# Patient Record
Sex: Female | Born: 1955 | Race: White | Hispanic: No | Smoking: Never smoker
Health system: Southern US, Community
[De-identification: ages and names within clinical notes are randomized; demographics above are authoritative.]

## PROBLEM LIST (undated history)

## (undated) HISTORY — PX: BREAST BIOPSY: SHX20

---

## 2014-09-17 ENCOUNTER — Ambulatory Visit (INDEPENDENT_AMBULATORY_CARE_PROVIDER_SITE_OTHER): Payer: BLUE CROSS/BLUE SHIELD | Admitting: Podiatry

## 2014-09-17 ENCOUNTER — Encounter: Payer: Self-pay | Admitting: Podiatry

## 2014-09-17 ENCOUNTER — Ambulatory Visit (INDEPENDENT_AMBULATORY_CARE_PROVIDER_SITE_OTHER): Payer: BLUE CROSS/BLUE SHIELD

## 2014-09-17 VITALS — BP 141/77 | HR 71 | Resp 16 | Ht 64.0 in | Wt 180.0 lb

## 2014-09-17 DIAGNOSIS — M79675 Pain in left toe(s): Secondary | ICD-10-CM

## 2014-09-17 DIAGNOSIS — L84 Corns and callosities: Secondary | ICD-10-CM

## 2014-09-17 DIAGNOSIS — M205X2 Other deformities of toe(s) (acquired), left foot: Secondary | ICD-10-CM

## 2014-09-17 DIAGNOSIS — M779 Enthesopathy, unspecified: Secondary | ICD-10-CM

## 2014-09-17 NOTE — Progress Notes (Signed)
   Subjective:    Patient ID: Kathryn Lynch, female    DOB: 05/22/1956, 59 y.o.   MRN: [id number]  HPI Comments: 59 year old female presents the office today with complaints of left fifth toe pain. She states that she's had discomfort to this toe for approximate 2 years has been intermittent. She states that she previously had surgery to that toe proximal me 10-15 years ago. [facility] the toenail removed twice. She states that she has pain overlying the toe particularly with pressure in certain shoe gear. She has been using quite pads, wrapping the tone having pedicures. She does state that when the skin gets scraped off the side of the toe she does have resolution of symptoms. Denies any recent injury or trauma. Denies any recent redness or increase in warmth to the area. No other complaints at this time.  Foot Pain      Review of Systems  Allergic/Immunologic: Positive for environmental allergies.  All other systems reviewed and are negative.      Objective:   Physical Exam AAO x3, NAD DP/PT pulses palpable bilaterally, CRT less than 3 seconds Protective sensation intact with Simms Weinstein monofilament, vibratory sensation intact, Achilles tendon reflex intact Scar over the dorsal aspect the left fifth digit from prior surgery which is well-healed. The left fifth digit since rotated position with the dorsal aspect of the toe sitting more laterally. The left toenail has previously been removed and overlying the nail bed there is thick hyperkeratotic tissue. There is trace edema overlying the fifth digit compared to the contralateral extremity. There is no overlying erythema or increase in warmth. Upon debridement of the hyperkeratotic lesion no underlying ulceration and no drainage. There is no clinical signs of infection at this time. There is mild tenderness to the overlying the area of hyperkeratotic tissue. No other areas of  pinpoint bony tenderness or pain the vibratory sensation to bilateral lower extremities. 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 MMT 5/5, ROM WNL No overlying edema, erythema, increase in warmth to bilateral lower extremity.  No other open lesions or pre-ulcerative lesions are identified. No pain with calf compression, swelling, warmth, erythema.         Assessment & Plan:  59 year old female left fifth digit painful hyperkeratotic lesion due to rotational digit. -X-rays were obtained and reviewed with the patient. -Conservative versus surgical options were discussed including alternatives, risks, competitions. -Discussed likely etiology of the patient's discomfort. It is likely due to the rotation of the toe causing pressure in shoe gear resulting in the symptomatic hyperkeratotic lesion. -Lesion which help a debrided without complications/bleeding. -Salinocaine was applied over the lesion with a donut pad and an occlusive dressing. Post procedure instructions were discussed the patient. Discussed with her that the area blisters she can use antibiotic ointment and a Band-Aid to keep the area clean. Monitor for any signs or symptoms of infection and directed to call the office should any occur. -At this time the patient also was inquiring about possible surgical intervention. I briefly discussed this with her. Discussed with her that if the area remains symptomatic will discussed this in more detail. -Follow-up at the area remains symptomatic with there is any problems. In the meantime, encouraged call the office with any questions, concerns, change in symptoms.

## 2014-09-29 ENCOUNTER — Ambulatory Visit: Payer: Self-pay | Admitting: Nurse Practitioner

## 2015-07-21 ENCOUNTER — Other Ambulatory Visit: Payer: Self-pay | Admitting: Family Medicine

## 2015-07-21 DIAGNOSIS — Z1231 Encounter for screening mammogram for malignant neoplasm of breast: Secondary | ICD-10-CM

## 2015-09-08 ENCOUNTER — Ambulatory Visit
Admission: RE | Admit: 2015-09-08 | Discharge: 2015-09-08 | Disposition: A | Discharge status: Home / Self Care | Payer: BLUE CROSS/BLUE SHIELD | Source: Ambulatory Visit | Attending: Family Medicine | Admitting: Family Medicine

## 2015-09-08 DIAGNOSIS — Z1231 Encounter for screening mammogram for malignant neoplasm of breast: Secondary | ICD-10-CM | POA: Diagnosis present

## 2015-09-22 ENCOUNTER — Other Ambulatory Visit: Payer: Self-pay | Admitting: Family Medicine

## 2015-09-22 DIAGNOSIS — R928 Other abnormal and inconclusive findings on diagnostic imaging of breast: Secondary | ICD-10-CM

## 2015-10-29 ENCOUNTER — Ambulatory Visit
Admission: RE | Admit: 2015-10-29 | Discharge: 2015-10-29 | Disposition: A | Discharge status: Home / Self Care | Payer: BLUE CROSS/BLUE SHIELD | Source: Ambulatory Visit | Attending: Family Medicine | Admitting: Family Medicine

## 2015-10-29 DIAGNOSIS — N6489 Other specified disorders of breast: Secondary | ICD-10-CM | POA: Diagnosis present

## 2015-10-29 DIAGNOSIS — R928 Other abnormal and inconclusive findings on diagnostic imaging of breast: Secondary | ICD-10-CM

## 2018-02-07 ENCOUNTER — Encounter: Payer: Self-pay | Admitting: Podiatry

## 2018-02-07 ENCOUNTER — Other Ambulatory Visit: Payer: Self-pay | Admitting: *Deleted

## 2018-02-07 ENCOUNTER — Ambulatory Visit: Payer: BLUE CROSS/BLUE SHIELD | Admitting: Podiatry

## 2018-02-07 DIAGNOSIS — M779 Enthesopathy, unspecified: Secondary | ICD-10-CM

## 2018-02-07 DIAGNOSIS — M2042 Other hammer toe(s) (acquired), left foot: Secondary | ICD-10-CM

## 2018-02-07 NOTE — Progress Notes (Signed)
This patient presents the office today for an evaluation and treatment of her painful fifth toe left foot.  She says this fifth toe has been painful since April.   She has a history of having the toenail removed twice.  She points to the painful area that is under the remnant nail fifth toe left.  No signs of redness, swelling or inflammation noted.  She also had surgery for the fifth toe left foot that included the removal of the head of the proximal phalanx.  General Appearance  Alert, conversant and in no acute stress.  Vascular  Dorsalis pedis and posterior tibial  pulses are palpable  bilaterally.  Capillary return is within normal limits  bilaterally. Temperature is within normal limits  bilaterally.  Neurologic  Senn-Weinstein monofilament wire test within normal limits  bilaterally. Muscle power within normal limits bilaterally.  Nails Absent nail fifth toe left foot.  Orthopedic  No limitations of motion of motion feet .  No crepitus or effusions noted.  No bony pathology or digital deformities noted. Palpable pain to the lateral base remnant bone left foot. 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  Skin  normotropic skin with no porokeratosis noted bilaterally.  No signs of infections or ulcers noted.    Bone spur fifth toe left foot secondary previous surgery.  ROV.  Padding dispensed fifth toe.  Discussed injection therapy as well as future surgery.  RTC prn.   Helane GuntherGregory Antario Yasuda DPM

## 2018-06-12 ENCOUNTER — Other Ambulatory Visit: Payer: Self-pay | Admitting: Family Medicine

## 2018-06-12 DIAGNOSIS — Z1231 Encounter for screening mammogram for malignant neoplasm of breast: Secondary | ICD-10-CM

## 2019-07-07 ENCOUNTER — Ambulatory Visit
Admission: RE | Admit: 2019-07-07 | Discharge: 2019-07-07 | Disposition: A | Discharge status: Home / Self Care | Payer: BC Managed Care – PPO | Source: Ambulatory Visit | Attending: Family Medicine | Admitting: Family Medicine

## 2019-07-07 DIAGNOSIS — Z1231 Encounter for screening mammogram for malignant neoplasm of breast: Secondary | ICD-10-CM

## 2019-09-22 ENCOUNTER — Ambulatory Visit: Payer: BC Managed Care – PPO | Attending: Internal Medicine

## 2019-09-22 DIAGNOSIS — Z20822 Contact with and (suspected) exposure to covid-19: Secondary | ICD-10-CM

## 2019-09-23 LAB — NOVEL CORONAVIRUS, NAA: SARS-CoV-2, NAA: NOT DETECTED

## 2020-12-28 ENCOUNTER — Other Ambulatory Visit: Payer: Self-pay | Admitting: Family Medicine

## 2020-12-28 DIAGNOSIS — Z1231 Encounter for screening mammogram for malignant neoplasm of breast: Secondary | ICD-10-CM

## 2021-01-03 ENCOUNTER — Other Ambulatory Visit: Payer: Self-pay

## 2021-01-03 ENCOUNTER — Ambulatory Visit
Admission: RE | Admit: 2021-01-03 | Discharge: 2021-01-03 | Disposition: A | Discharge status: Home / Self Care | Payer: BC Managed Care – PPO | Source: Ambulatory Visit | Attending: Family Medicine | Admitting: Family Medicine

## 2021-01-03 DIAGNOSIS — Z1231 Encounter for screening mammogram for malignant neoplasm of breast: Secondary | ICD-10-CM | POA: Diagnosis present

## 2021-01-06 ENCOUNTER — Other Ambulatory Visit: Payer: Self-pay | Admitting: Family Medicine

## 2021-01-06 DIAGNOSIS — N6489 Other specified disorders of breast: Secondary | ICD-10-CM

## 2021-01-06 DIAGNOSIS — R928 Other abnormal and inconclusive findings on diagnostic imaging of breast: Secondary | ICD-10-CM

## 2021-01-21 ENCOUNTER — Other Ambulatory Visit: Payer: Self-pay

## 2021-01-21 ENCOUNTER — Ambulatory Visit
Admission: RE | Admit: 2021-01-21 | Discharge: 2021-01-21 | Disposition: A | Discharge status: Home / Self Care | Payer: BC Managed Care – PPO | Source: Ambulatory Visit | Attending: Family Medicine | Admitting: Family Medicine

## 2021-01-21 DIAGNOSIS — N6489 Other specified disorders of breast: Secondary | ICD-10-CM | POA: Insufficient documentation

## 2021-01-21 DIAGNOSIS — R928 Other abnormal and inconclusive findings on diagnostic imaging of breast: Secondary | ICD-10-CM

## 2022-02-06 ENCOUNTER — Other Ambulatory Visit: Payer: Self-pay | Admitting: Anesthesiology

## 2022-02-06 DIAGNOSIS — Z1231 Encounter for screening mammogram for malignant neoplasm of breast: Secondary | ICD-10-CM

## 2022-03-17 ENCOUNTER — Ambulatory Visit
Admission: RE | Admit: 2022-03-17 | Discharge: 2022-03-17 | Disposition: A | Discharge status: Home / Self Care | Payer: No Typology Code available for payment source | Source: Ambulatory Visit | Attending: Anesthesiology | Admitting: Anesthesiology

## 2022-03-17 DIAGNOSIS — Z1231 Encounter for screening mammogram for malignant neoplasm of breast: Secondary | ICD-10-CM | POA: Diagnosis present

## 2022-03-20 ENCOUNTER — Other Ambulatory Visit: Payer: Self-pay | Admitting: Family Medicine

## 2022-08-15 ENCOUNTER — Ambulatory Visit: Payer: No Typology Code available for payment source | Admitting: Podiatry

## 2022-08-15 DIAGNOSIS — L989 Disorder of the skin and subcutaneous tissue, unspecified: Secondary | ICD-10-CM | POA: Diagnosis not present

## 2022-08-15 NOTE — Progress Notes (Signed)
   Chief Complaint  Patient presents with   Toe Pain    Patient is here for left foot pinky toe pain from a callous.   Callouses    HPI: 66 y.o. female presenting today as a reestablish new patient for evaluation of a symptomatic skin lesion to the left fifth digit.  Patient states that several years prior she had the toenail removed permanently and developed a callus to the area.  Recently it has become symptomatic.  She states that she has been here in the past several years prior and the area was debrided with significant improvement and relief of symptoms  No past medical history on file.  Past Surgical History:  Procedure Laterality Date   BREAST BIOPSY Left 10- 15 years ago   In [place] -- Hx aquired from patient - neg    No Known Allergies   Physical Exam: General: The patient is alert and oriented x3 in no acute distress.  Dermatology: Skin is warm, dry and supple bilateral lower extremities. Negative for open lesions or macerations.  Absence of the left fifth digit nail plate superimposed with callus tissue and associated tenderness with palpation  Vascular: Palpable pedal pulses bilaterally. Capillary refill within normal limits.  Negative for any significant edema or erythema  Neurological: Light touch and protective threshold grossly intact  Musculoskeletal Exam: No pedal deformities noted  Assessment/plan of care: 1.  Symptomatic callus at the nail matricectomy site left fifth digit -Debrided using a combination of a tissue nipper and rotary bur without incident or bleeding.  Patient felt significant relief -Recommend wide fitting shoes that do not irritate or constrict the toebox area -Return to clinic as needed   Felecia Shelling, DPM [facility]  Dr. Felecia Shelling, DPM    [address] [place], [place] [address]                Office [phone]  Fax [phone]

## 2022-08-26 IMAGING — MG MM DIGITAL SCREENING BILAT W/ TOMO AND CAD
8 series · 8 of 24 positions shown · non-contrast
Comparison: Previous exam(s).

CLINICAL DATA: Screening.

EXAM:
DIGITAL SCREENING BILATERAL MAMMOGRAM WITH TOMOSYNTHESIS AND CAD
TECHNIQUE: Bilateral screening digital craniocaudal and mediolateral oblique
mammograms were obtained. Bilateral screening digital breast
tomosynthesis was performed. The images were evaluated with
computer-aided detection.

[L MLO synth-2D]
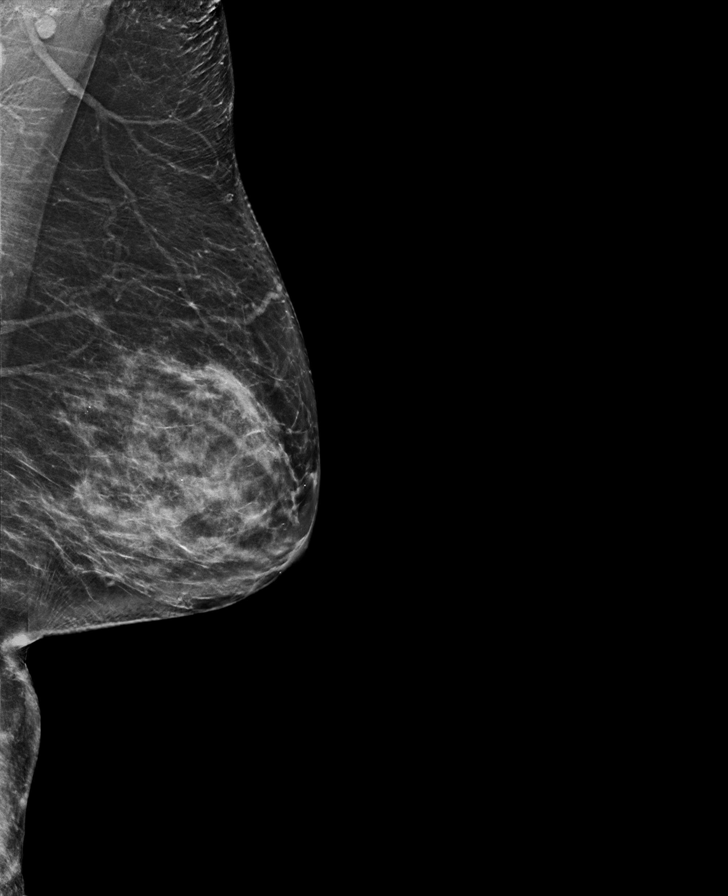

[L CC synth-2D]
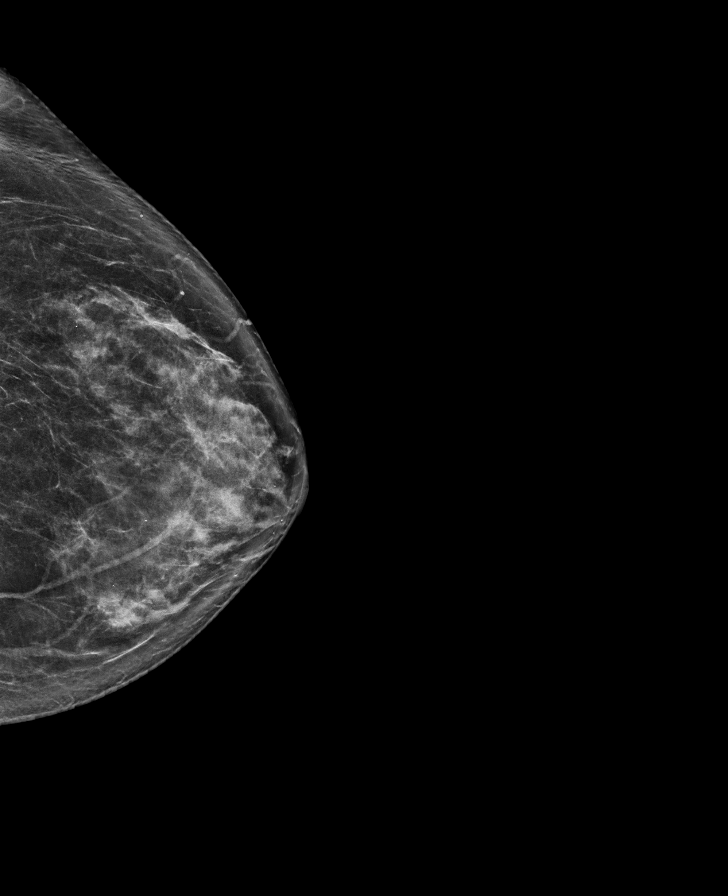

[R MLO synth-2D]
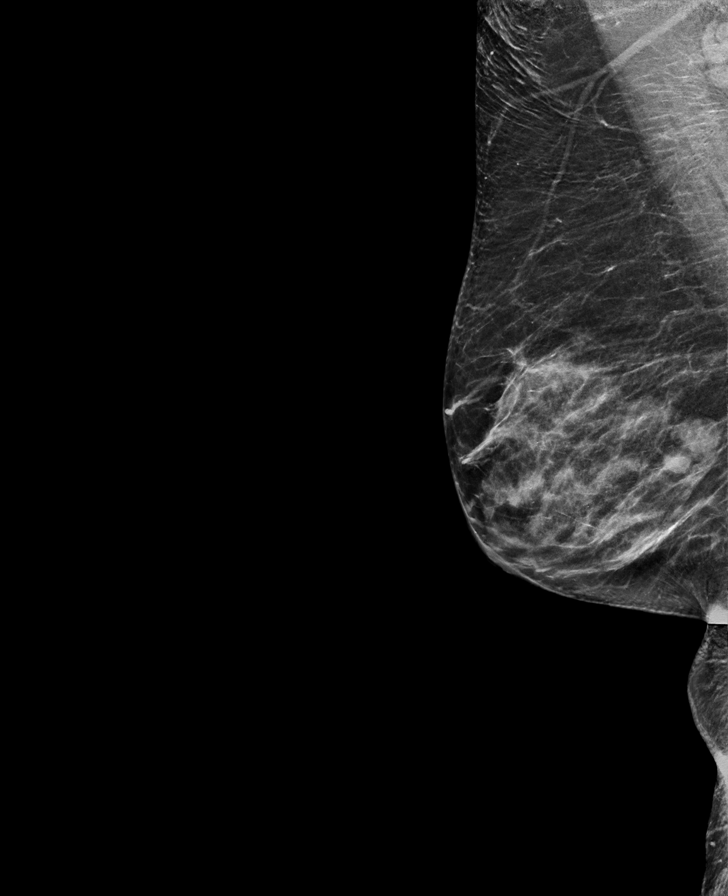

[R CC synth-2D]
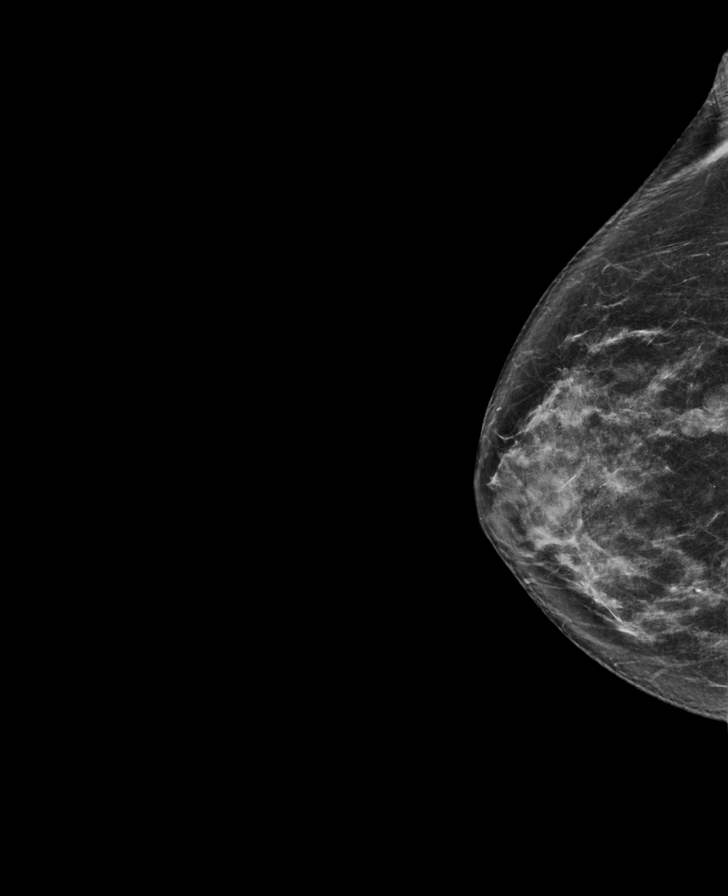

[R CC tomo · tomo slice 38/75.0]
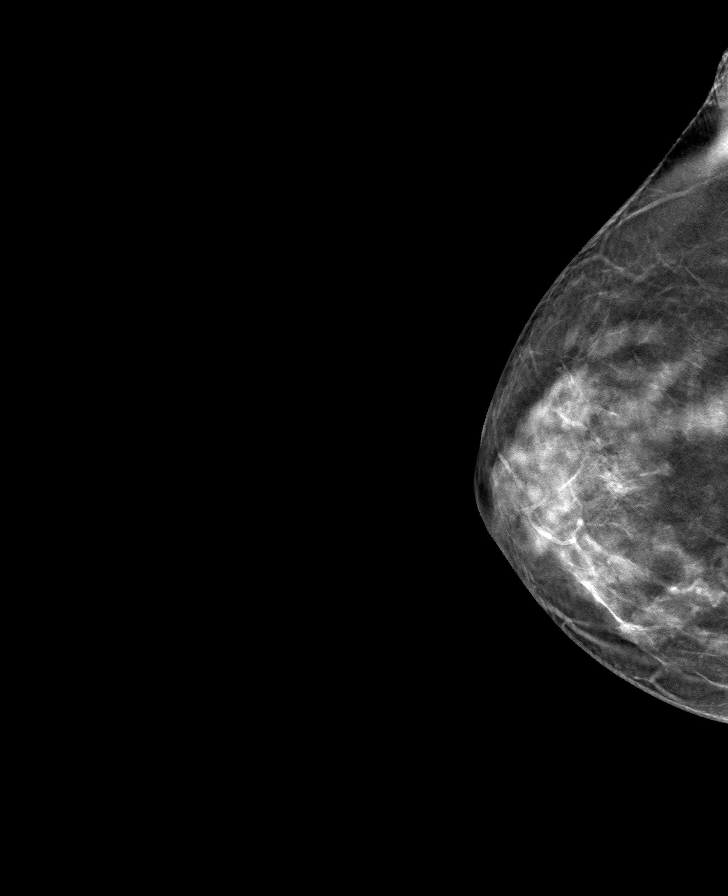

[R MLO tomo · tomo slice 31/61.0]
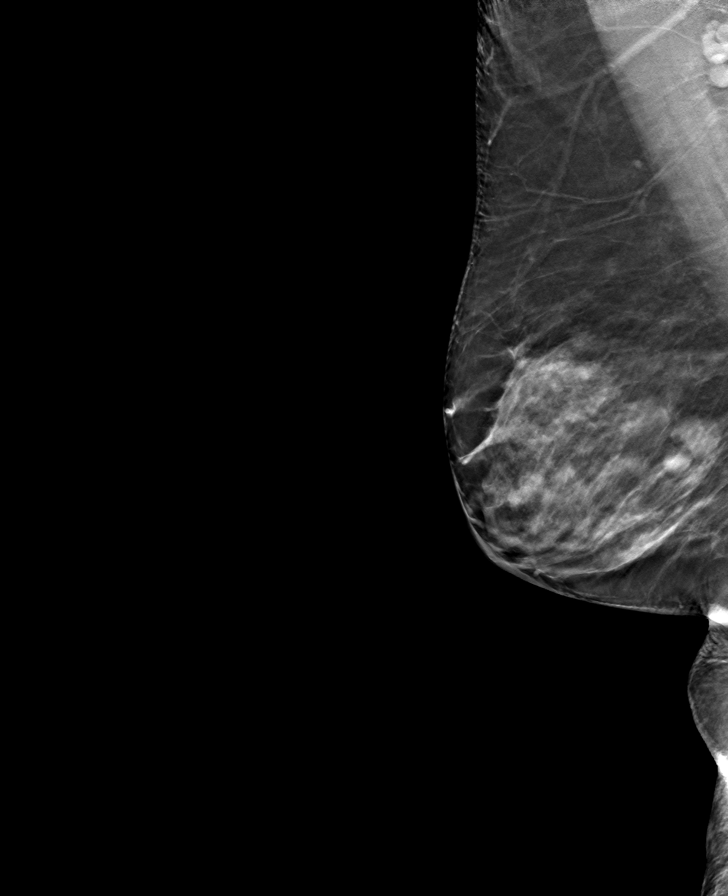

[L MLO tomo · tomo slice 39/78.0]
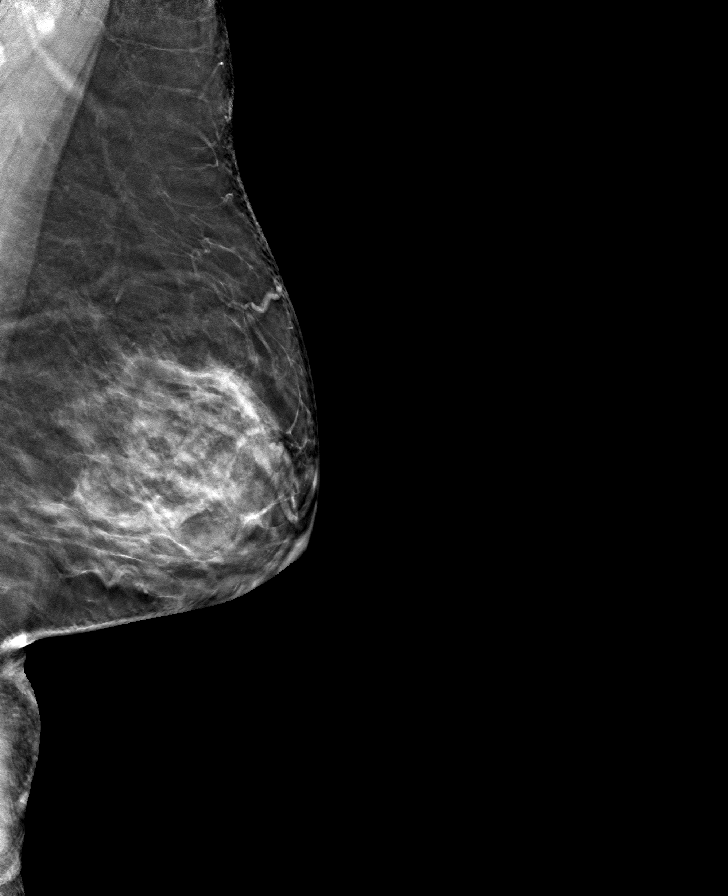

[L CC tomo · tomo slice 37/74.0]
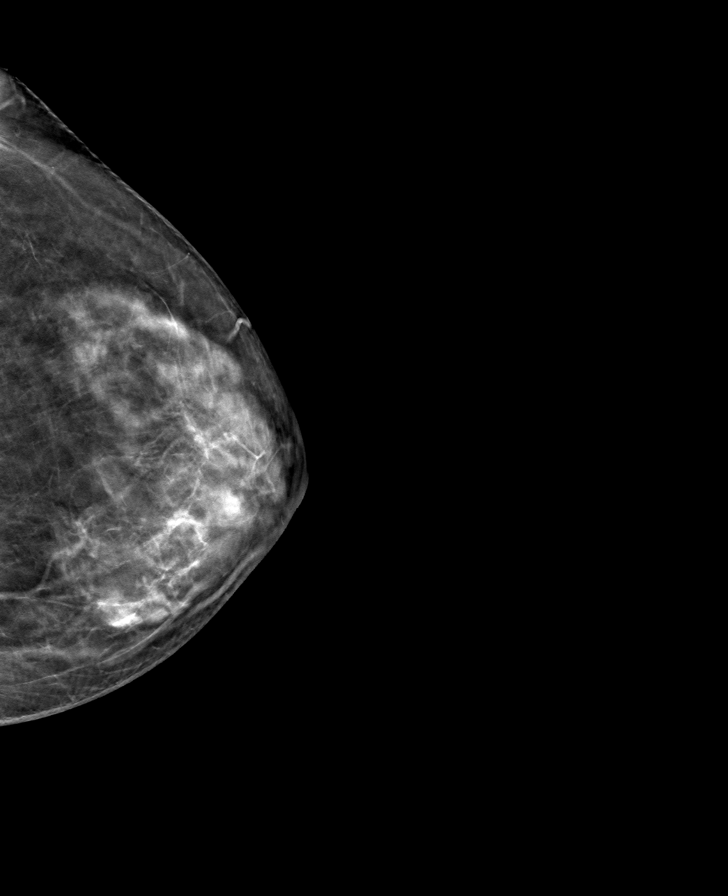

[8 of 24 positions shown; findings below may reference images not displayed]

ACR Breast Density Category c: The breast tissue is heterogeneously
dense, which may obscure small masses.
FINDINGS: In the left breast, a possible asymmetry warrants further
evaluation. In the right breast, no findings suspicious for
malignancy.
IMPRESSION: Further evaluation is suggested for possible asymmetry in the left
breast.

RECOMMENDATION:
Diagnostic mammogram and possibly ultrasound of the left breast.
(Code:[id number]-V-11X)

The patient will be contacted regarding the findings, and additional
imaging will be scheduled.

BI-RADS CATEGORY  0: Incomplete. Need additional imaging evaluation
and/or prior mammograms for comparison.

## 2023-02-07 ENCOUNTER — Other Ambulatory Visit: Payer: Self-pay | Admitting: Family Medicine

## 2023-02-07 DIAGNOSIS — Z1231 Encounter for screening mammogram for malignant neoplasm of breast: Secondary | ICD-10-CM

## 2023-02-07 DIAGNOSIS — Z780 Asymptomatic menopausal state: Secondary | ICD-10-CM

## 2023-02-07 DIAGNOSIS — Z13820 Encounter for screening for osteoporosis: Secondary | ICD-10-CM

## 2023-06-29 ENCOUNTER — Other Ambulatory Visit: Payer: No Typology Code available for payment source

## 2023-08-23 ENCOUNTER — Ambulatory Visit
Admission: RE | Admit: 2023-08-23 | Discharge: 2023-08-23 | Disposition: A | Discharge status: Home / Self Care | Payer: No Typology Code available for payment source | Source: Ambulatory Visit | Attending: Family Medicine | Admitting: Family Medicine

## 2023-08-23 DIAGNOSIS — Z1231 Encounter for screening mammogram for malignant neoplasm of breast: Secondary | ICD-10-CM | POA: Insufficient documentation

## 2023-08-23 DIAGNOSIS — Z13820 Encounter for screening for osteoporosis: Secondary | ICD-10-CM | POA: Diagnosis present

## 2023-08-23 DIAGNOSIS — Z780 Asymptomatic menopausal state: Secondary | ICD-10-CM | POA: Diagnosis present

## 2023-08-30 ENCOUNTER — Other Ambulatory Visit: Payer: Self-pay | Admitting: Family Medicine

## 2023-08-30 DIAGNOSIS — R928 Other abnormal and inconclusive findings on diagnostic imaging of breast: Secondary | ICD-10-CM

## 2023-09-04 ENCOUNTER — Ambulatory Visit
Admission: RE | Admit: 2023-09-04 | Discharge: 2023-09-04 | Disposition: A | Discharge status: Home / Self Care | Payer: No Typology Code available for payment source | Source: Ambulatory Visit | Attending: Family Medicine | Admitting: Family Medicine

## 2023-09-04 DIAGNOSIS — R928 Other abnormal and inconclusive findings on diagnostic imaging of breast: Secondary | ICD-10-CM | POA: Diagnosis present

## 2023-09-14 ENCOUNTER — Other Ambulatory Visit: Payer: Self-pay | Admitting: Family Medicine

## 2023-09-14 DIAGNOSIS — R928 Other abnormal and inconclusive findings on diagnostic imaging of breast: Secondary | ICD-10-CM

## 2023-09-14 DIAGNOSIS — N6489 Other specified disorders of breast: Secondary | ICD-10-CM

## 2023-09-21 ENCOUNTER — Ambulatory Visit
Admission: RE | Admit: 2023-09-21 | Discharge: 2023-09-21 | Disposition: A | Discharge status: Home / Self Care | Payer: No Typology Code available for payment source | Source: Ambulatory Visit | Attending: Family Medicine | Admitting: Family Medicine

## 2023-09-21 ENCOUNTER — Other Ambulatory Visit: Payer: Self-pay | Admitting: Family Medicine

## 2023-09-21 DIAGNOSIS — R928 Other abnormal and inconclusive findings on diagnostic imaging of breast: Secondary | ICD-10-CM | POA: Insufficient documentation

## 2023-09-21 DIAGNOSIS — N6489 Other specified disorders of breast: Secondary | ICD-10-CM | POA: Insufficient documentation

## 2023-09-21 HISTORY — PX: BREAST BIOPSY: SHX20

## 2023-09-21 MED ORDER — LIDOCAINE 1 % OPTIME INJ - NO CHARGE
5.0000 mL | Freq: Once | INTRAMUSCULAR | Status: AC
  Administered 2023-09-21: 5 mL
  Filled 2023-09-21: qty 6

## 2023-09-21 MED ORDER — LIDOCAINE HCL (PF) 1 % IJ SOLN
5.0000 mL | Freq: Once | INTRAMUSCULAR | Status: AC
  Administered 2023-09-21: 5 mL
  Filled 2023-09-21: qty 5

## 2023-09-21 MED ORDER — LIDOCAINE-EPINEPHRINE 1 %-1:100000 IJ SOLN
10.0000 mL | Freq: Once | INTRAMUSCULAR | Status: AC
  Administered 2023-09-21: 10 mL
  Filled 2023-09-21: qty 10

## 2023-09-21 MED ORDER — LIDOCAINE-EPINEPHRINE 1 %-1:100000 IJ SOLN
20.0000 mL | Freq: Once | INTRAMUSCULAR | Status: AC
  Administered 2023-09-21: 20 mL
  Filled 2023-09-21: qty 20

## 2023-09-24 LAB — SURGICAL PATHOLOGY

## 2023-09-25 ENCOUNTER — Encounter: Payer: Self-pay | Admitting: *Deleted

## 2023-09-25 NOTE — Progress Notes (Signed)
Referral recieved from [facility] for benign breast mass.   VM left for patient to return call to discuss surgical referral.

## 2023-09-26 ENCOUNTER — Encounter: Payer: Self-pay | Admitting: *Deleted

## 2023-09-26 NOTE — Progress Notes (Signed)
Spoke with Ms. Chrest and she would like to be seen at [facility].   Referral sent, their office will call her with the appointment.

## 2023-11-02 ENCOUNTER — Other Ambulatory Visit: Payer: Self-pay | Admitting: Surgery

## 2023-11-02 DIAGNOSIS — N6489 Other specified disorders of breast: Secondary | ICD-10-CM

## 2024-07-22 ENCOUNTER — Encounter: Payer: Self-pay | Admitting: Internal Medicine
